# Patient Record
Sex: Female | Born: 1995 | Race: White | Hispanic: No | Marital: Single | Smoking: Never smoker
Health system: Southern US, Community
[De-identification: ages and names within clinical notes are randomized; demographics above are authoritative.]

## PROBLEM LIST (undated history)

## (undated) DIAGNOSIS — R51 Headache: Secondary | ICD-10-CM

## (undated) DIAGNOSIS — N390 Urinary tract infection, site not specified: Secondary | ICD-10-CM

## (undated) DIAGNOSIS — Z9049 Acquired absence of other specified parts of digestive tract: Secondary | ICD-10-CM

## (undated) DIAGNOSIS — S060X9A Concussion with loss of consciousness of unspecified duration, initial encounter: Secondary | ICD-10-CM

## (undated) HISTORY — DX: Headache: R51

## (undated) HISTORY — DX: Acquired absence of other specified parts of digestive tract: Z90.49

## (undated) HISTORY — DX: Urinary tract infection, site not specified: N39.0

## (undated) HISTORY — DX: Concussion with loss of consciousness of unspecified duration, initial encounter: S06.0X9A

---
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
## 2016-01-12 ENCOUNTER — Ambulatory Visit (INDEPENDENT_AMBULATORY_CARE_PROVIDER_SITE_OTHER): Payer: BLUE CROSS/BLUE SHIELD

## 2016-01-12 ENCOUNTER — Ambulatory Visit (INDEPENDENT_AMBULATORY_CARE_PROVIDER_SITE_OTHER): Payer: BLUE CROSS/BLUE SHIELD | Admitting: Podiatry

## 2016-01-12 ENCOUNTER — Encounter: Payer: Self-pay | Admitting: Podiatry

## 2016-01-12 DIAGNOSIS — M79671 Pain in right foot: Secondary | ICD-10-CM

## 2016-01-12 DIAGNOSIS — S92302G Fracture of unspecified metatarsal bone(s), left foot, subsequent encounter for fracture with delayed healing: Secondary | ICD-10-CM

## 2016-01-12 NOTE — Progress Notes (Signed)
   Subjective:    Patient ID: Brandi Blackwell, female    DOB: 1995-05-09, 20 y.o.   MRN: [id number]  HPI Chief Complaint  Patient presents with  . Foot Pain    Left foot; lateral side & plantar forefoot; pt stated, "Broke foot February 2016; pain radiates up foot to above the ankle"     Review of Systems  All other systems reviewed and are negative.      Objective:   Physical Exam        Assessment & Plan:

## 2016-01-13 ENCOUNTER — Telehealth: Payer: Self-pay | Admitting: *Deleted

## 2016-01-13 DIAGNOSIS — S92302G Fracture of unspecified metatarsal bone(s), left foot, subsequent encounter for fracture with delayed healing: Secondary | ICD-10-CM

## 2016-01-13 NOTE — Telephone Encounter (Signed)
Dr. Charlsie Merlesegal ordered MRI left foot, r/o non-union fracture left 5th metatarsal base. [facility]

## 2016-01-14 NOTE — Progress Notes (Signed)
Subjective:     Patient ID: Brandi Blackwell, female   DOB: 09-09-1995, 20 y.o.   MRN: [id number]  HPI patient presents with family stating foot last year and it still giving her a lot of problems and she cannot be active   Review of Systems  All other systems reviewed and are negative.      Objective:   Physical Exam  Constitutional: She is oriented to person, place, and time.  Cardiovascular: Intact distal pulses.   Musculoskeletal: Normal range of motion.  Neurological: She is oriented to person, place, and time.  Skin: Skin is warm.  Nursing note and vitals reviewed.  neurovascular status is found to be intact with muscle strength adequate range of motion within normal limits with patient noted to have inflammation and pain in the left lateral foot around the base of the fifth metatarsal. There is fluid buildup in the area with redness and it does have exquisite discomfort when I palpated deep into the tissue. Patient's found have good digital perfusion and is well oriented 3     Assessment:     Possibility for nonunion of bone injury left or some form of soft tissue injury    Plan:     H&P x-rays reviewed and at this point I am sending for MRI to rule out an nonunion the left base of fifth metatarsal. Could not make determination on conventional x-rays  X-ray report was negative for nonunion but so difficult to tell him with 18 month history of pain cannot be discounted

## 2016-01-27 ENCOUNTER — Telehealth: Payer: Self-pay | Admitting: *Deleted

## 2016-01-27 NOTE — Telephone Encounter (Signed)
"  Patient needs prior authorization for MRI."    Authorization was obtained and faxed.

## 2016-01-28 ENCOUNTER — Ambulatory Visit
Admission: RE | Admit: 2016-01-28 | Discharge: 2016-01-28 | Disposition: A | Discharge status: Home / Self Care | Payer: Self-pay | Source: Ambulatory Visit | Attending: Podiatry | Admitting: Podiatry

## 2016-02-11 ENCOUNTER — Ambulatory Visit (INDEPENDENT_AMBULATORY_CARE_PROVIDER_SITE_OTHER): Payer: BLUE CROSS/BLUE SHIELD | Admitting: Podiatry

## 2016-02-11 ENCOUNTER — Encounter: Payer: Self-pay | Admitting: Podiatry

## 2016-02-11 DIAGNOSIS — M779 Enthesopathy, unspecified: Secondary | ICD-10-CM

## 2016-02-11 DIAGNOSIS — S92302G Fracture of unspecified metatarsal bone(s), left foot, subsequent encounter for fracture with delayed healing: Secondary | ICD-10-CM

## 2016-02-11 DIAGNOSIS — G8921 Chronic pain due to trauma: Secondary | ICD-10-CM | POA: Diagnosis not present

## 2016-02-11 MED ORDER — TRIAMCINOLONE ACETONIDE 10 MG/ML IJ SUSP
10.0000 mg | Freq: Once | INTRAMUSCULAR | Status: AC
  Administered 2016-02-11: 10 mg

## 2016-02-11 MED ORDER — GABAPENTIN 300 MG PO CAPS: 300.0000 mg | ORAL_CAPSULE | Freq: Three times a day (TID) | ORAL | 3 refills | Status: DC

## 2016-02-11 NOTE — Progress Notes (Signed)
Subjective:     Patient ID: Brandi Blackwell, female   DOB: 1995/10/25, 20 y.o.   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 Patient states the pain is worse when trying to ambulate and she does have a boot at home   Review of Systems     Objective:   Physical Exam Neurovascular status was intact with mild mottled skin formation of the lateral side left foot with no indications of current bone pathology. MRI was normal and is not indicate there is a bony process going on and the pain is quite intense around the insertion base of fifth metatarsal    Assessment:     Possibility for a chronic acute type tenderness condition left at the insertion peroneal into the base of fifth metatarsal versus the possibilities for some form of chronic pain syndrome    Plan:     H&P condition reviewed and careful sheath injection administered 3 Milligan Kenalog 5 mill grams Xylocaine and also went ahead today and placed on gabapentin 3 mg 3 times a day and we'll start cream to try to reduce the inflammatory or neurological process. She will utilize immobilization at home and will be seen back in 3-4 weeks to reevaluate or earlier if needed

## 2016-02-12 MED ORDER — NONFORMULARY OR COMPOUNDED ITEM: 2 refills | Status: AC

## 2016-02-12 NOTE — Addendum Note (Signed)
Addended by: Alphia Kava'CONNELL, VALERY D on: 02/12/2016 12:05 PM   Modules accepted: Orders

## 2016-03-15 ENCOUNTER — Ambulatory Visit (INDEPENDENT_AMBULATORY_CARE_PROVIDER_SITE_OTHER): Payer: BLUE CROSS/BLUE SHIELD

## 2016-03-15 ENCOUNTER — Ambulatory Visit (INDEPENDENT_AMBULATORY_CARE_PROVIDER_SITE_OTHER): Payer: BLUE CROSS/BLUE SHIELD | Admitting: Podiatry

## 2016-03-15 DIAGNOSIS — M79672 Pain in left foot: Secondary | ICD-10-CM

## 2016-03-15 DIAGNOSIS — S92302G Fracture of unspecified metatarsal bone(s), left foot, subsequent encounter for fracture with delayed healing: Secondary | ICD-10-CM | POA: Diagnosis not present

## 2016-03-16 LAB — URIC ACID: URIC ACID, SERUM: 3.7 mg/dL (ref 2.5–7.0)

## 2016-03-16 LAB — SEDIMENTATION RATE: SED RATE: 4 mm/h (ref 0–20)

## 2016-03-16 LAB — RHEUMATOID FACTOR: Rhuematoid fact SerPl-aCnc: <14 IU/mL (ref <14)

## 2016-03-16 LAB — ANA: Anti Nuclear Antibody(ANA): NEGATIVE

## 2016-03-16 LAB — C-REACTIVE PROTEIN: CRP: 2.2 mg/L (ref <8.0)

## 2016-03-17 NOTE — Progress Notes (Signed)
Subjective:     Patient ID: Brandi Blackwell, female   DOB: [birth year], 20 y.o.   MRN: [id number]  HPI patient presents with mother with continued discomfort in the outside of the left foot around the base of the fifth metatarsal with patient stating that she could not take the gabapentin as it made her drowsy she cannot utilize the cream and while in the boot it is mildly better but when she gets out of it she continues to have pain and it's now been present for approximately 20 months since tentative diagnosis of fracture of the fifth metatarsal base left   Review of Systems     Objective:   Physical Exam Neurovascular status remains intact with no mottled skin appearance no coolness to the foot note sweating noted. There is quite a bit of discomfort at the insertion of the base of the fifth metatarsal left and I can make the argument that there is some swelling around this area of a mild to moderate nature. The tendon itself is functioning well but mild discomfort proximal to this area in the peroneal brevis tendon but not to the same degree as insertion    Assessment:     Very difficult problem with possibility for some kind of chronic pain syndrome versus possibility for subtle damage to the fifth metatarsal base with MRI and x-rays up to this point that have been normal and also cannot rule out some kind of a systemic inflammatory condition and again discussed the fact that when her hip was damaged it took several years to get moderately better and still gives her some trouble from when she was in high school    Plan:     H&P and x-ray and condition reviewed at great length. At this point I am sending her for blood panel to rule out an arthritic issue and I'm asking for her records from the original injury so I can compare them to today. Patient may ultimately require surgery but I'm trying to be as conservative as I can't but I am concerned about the continued significant discomfort she is  experiencing  X-ray report indicates there could be some subtle changes around the base of the fifth metatarsal but no indications of a nonunion or delayed union process

## 2016-04-26 ENCOUNTER — Ambulatory Visit: Payer: BLUE CROSS/BLUE SHIELD | Admitting: Podiatry

## 2016-04-29 ENCOUNTER — Ambulatory Visit: Payer: BLUE CROSS/BLUE SHIELD | Admitting: Podiatry

## 2016-05-06 ENCOUNTER — Ambulatory Visit: Payer: BLUE CROSS/BLUE SHIELD | Admitting: Podiatry

## 2016-05-20 ENCOUNTER — Ambulatory Visit: Payer: BLUE CROSS/BLUE SHIELD | Admitting: Podiatry

## 2017-01-18 ENCOUNTER — Telehealth: Payer: Self-pay | Admitting: Podiatry

## 2017-01-18 NOTE — Telephone Encounter (Signed)
My daughter saw Dr. Charlsie Merles and had x-rays done to see what could be done for a fracture she suffered in 2016. She has an appointment with another specialist in [place] on Monday. I was calling to see about getting her images and notes sent to that office. If you would please call me back on my cell phone at [phone]. That would be awesome. Thanks.

## 2017-01-20 ENCOUNTER — Encounter: Payer: Self-pay | Admitting: Podiatry

## 2017-01-20 NOTE — Progress Notes (Addendum)
CD of X-rays put up front to be mailed to [facility] today per signed medical records release form.

## 2017-08-04 IMAGING — MR MR FOOT*L* W/O CM
4 of 6 series · 21 of 40 positions shown · non-contrast
Comparison: None.

CLINICAL DATA: Fracture at the base of the fifth metatarsal 1 year
ago. Immobilized in a boot for 3 weeks. Did well until recently,
pain returned.

EXAM:
MRI OF THE LEFT FOOT WITHOUT CONTRAST
TECHNIQUE: Multiplanar, multisequence MR imaging was performed. No intravenous
contrast was administered.

[Series 5: T1 · coronal · 4.0mm · 0.38mm/px · 3 of 27 slices shown]
[im 5/27]
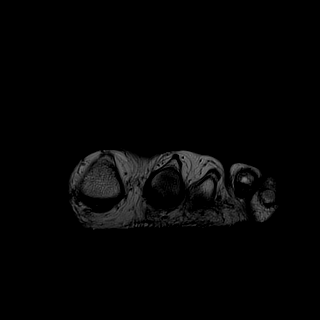
[im 14/27]
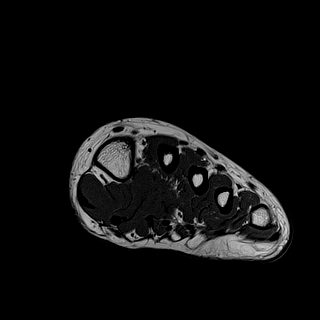
[im 22/27]
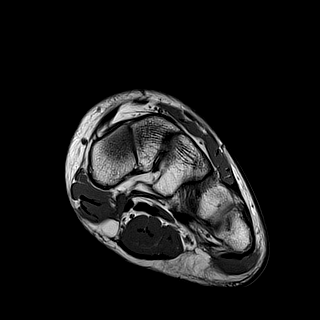

[Series 6: T2 fat-sat · coronal · 4.0mm · 0.23mm/px · 8 of 27 slices shown (1 of 3)]
[im 1/27]
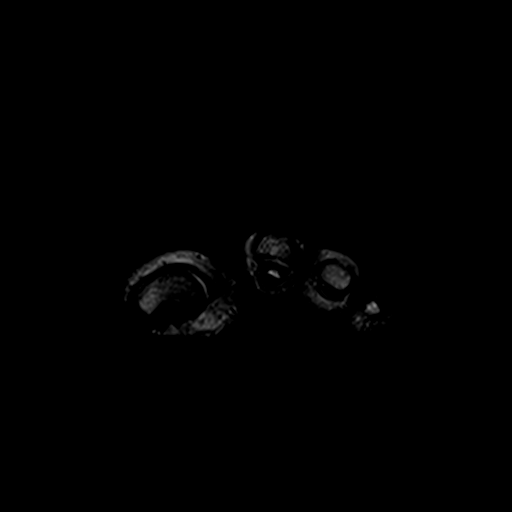
[im 4/27]
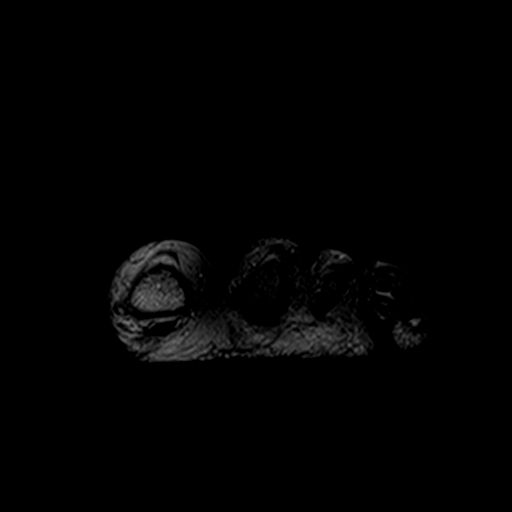
[im 8/27]
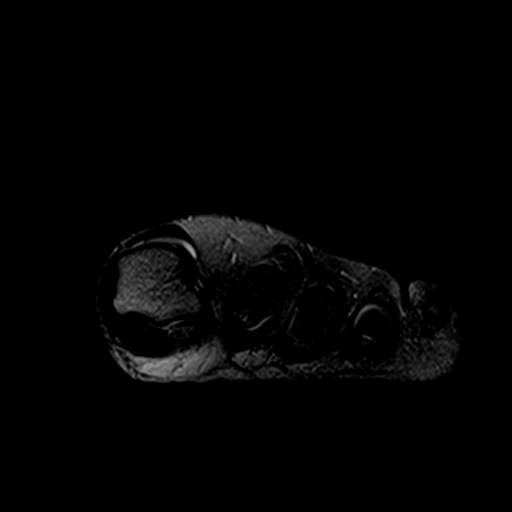
[im 12/27]
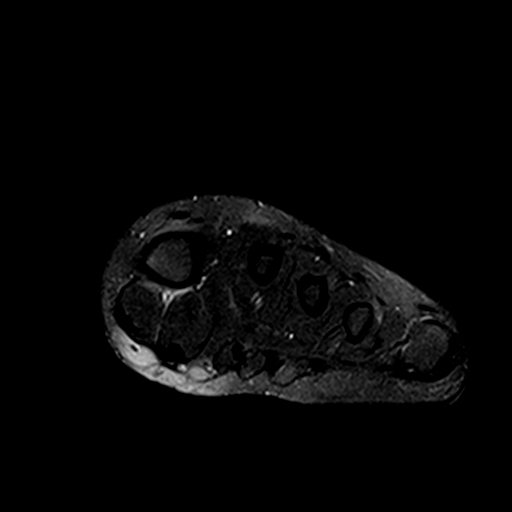
[im 15/27]
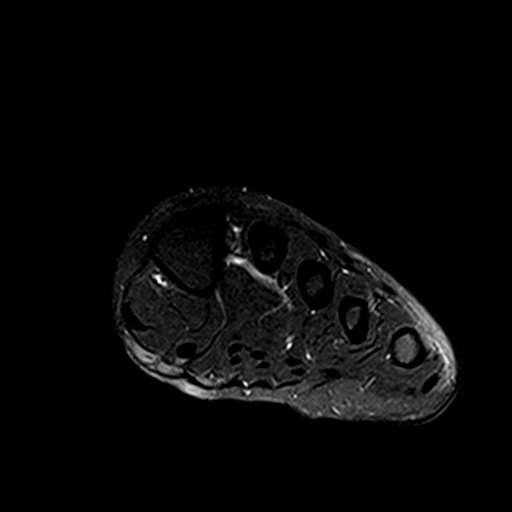
[im 19/27]
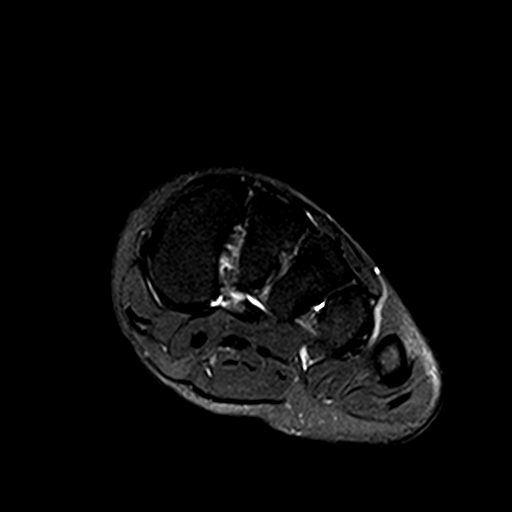
[im 23/27]
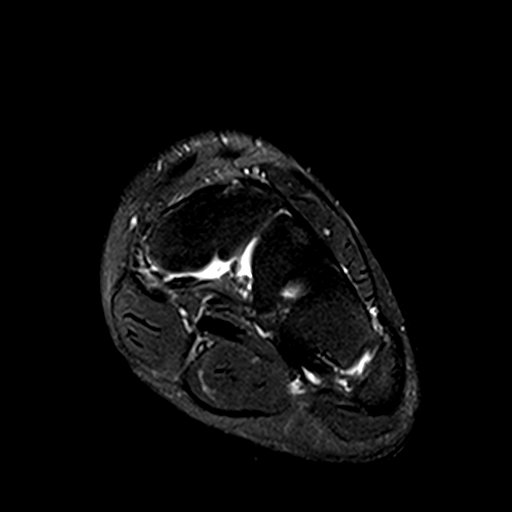
[im 27/27]
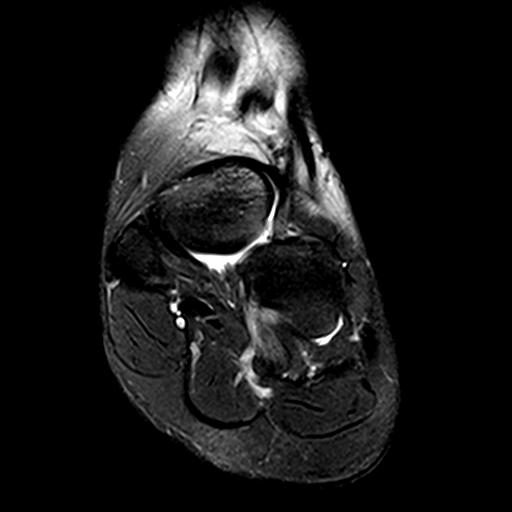

[Series 8: T2 fat-sat · axial · 2.5mm · 0.35mm/px · z∈[-103,-57]mm · 6 of 20 slices shown (2 of 3)]
[im 1/20]
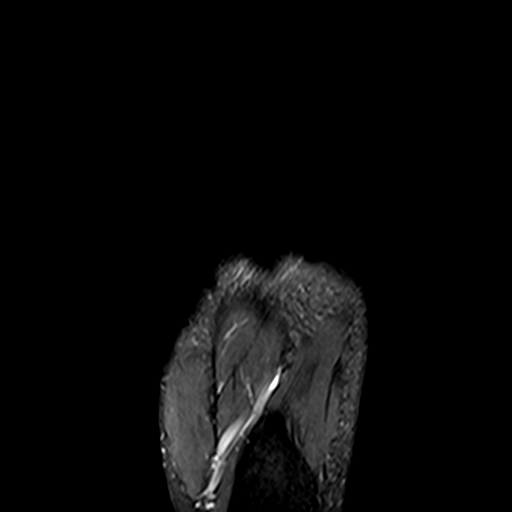
[im 4/20]
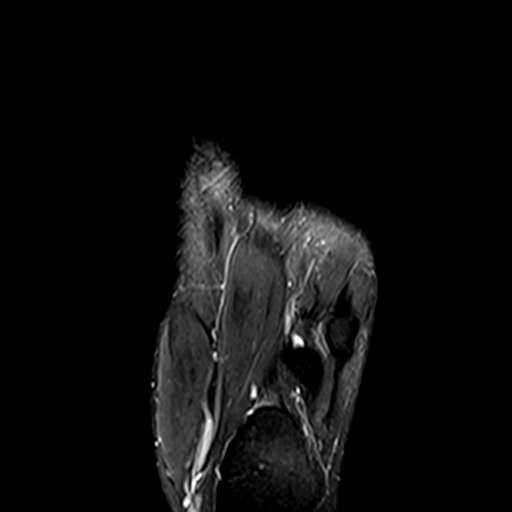
[im 8/20]
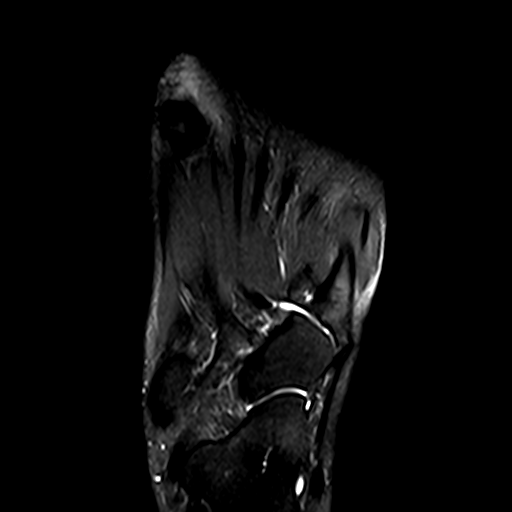
[im 12/20]
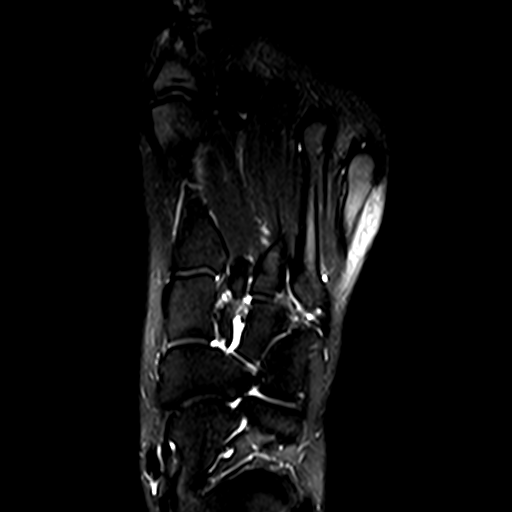
[im 16/20]
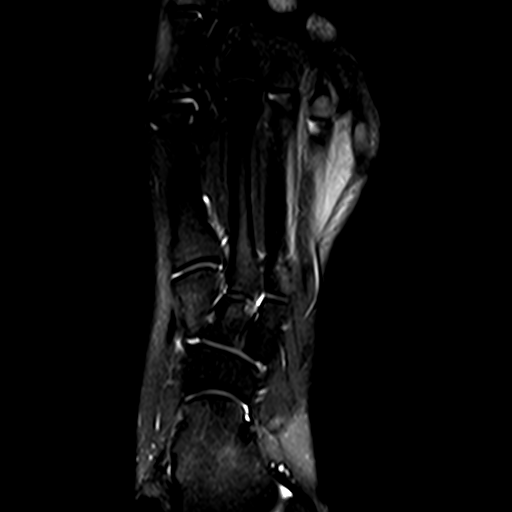
[im 20/20]
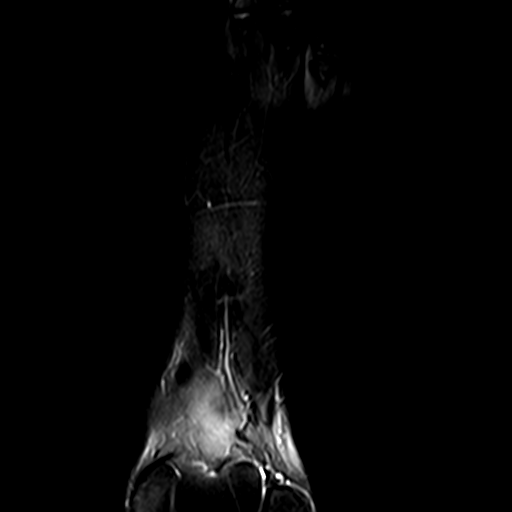

[Series 9: T2 fat-sat · sagittal · 3.0mm · 0.35mm/px · 4 of 25 slices shown (3 of 3)]
[im 1/25]
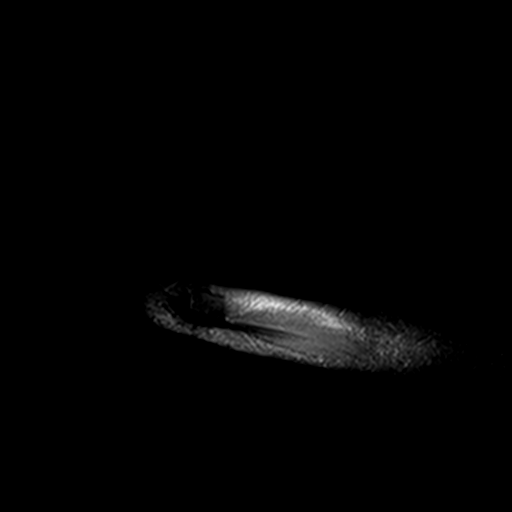
[im 5/25]
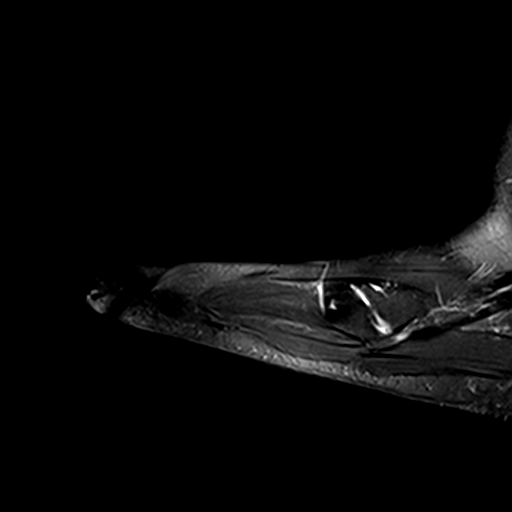
[im 13/25]
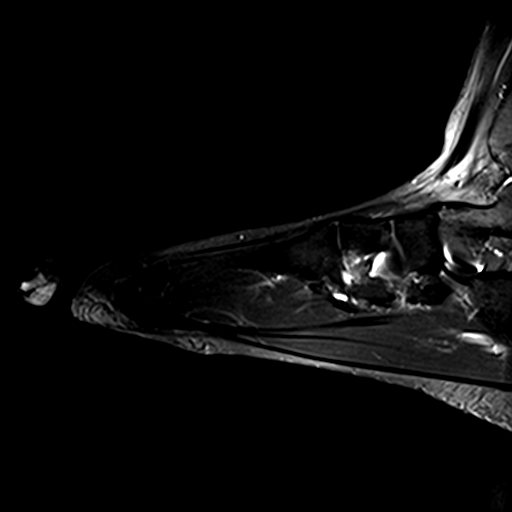
[im 21/25]
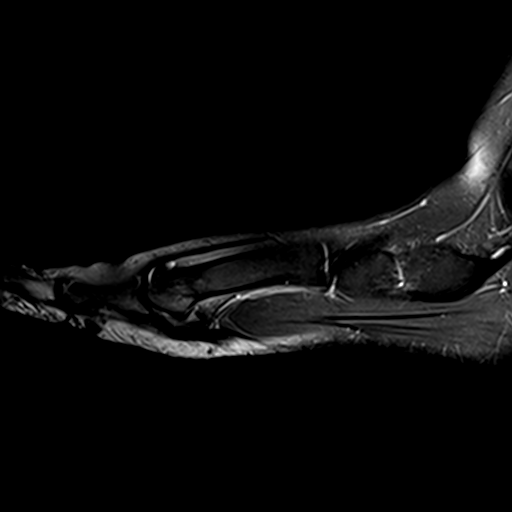

[21 of 40 positions shown; findings below may reference images not displayed]

FINDINGS: Bones/Joint/Cartilage

Mild marrow edema in the fifth metatarsal shaft without a linear
signal abnormality most consistent with stress reaction. No
periosteal reaction.

No acute fracture or dislocation. Normal alignment. No joint
effusion. Mild osteoarthritis of the first MTP joint with mild
subchondral reactive marrow changes in the first metatarsal head.
Bipartite medial hallux sesamoid.

Ligaments

Collateral ligaments are intact. Lisfranc ligament is intact.
Visualized anterior talofibular ligament is intact.

Muscles and Tendons
Flexor, peroneal and extensor compartment tendons are intact.
Muscles are normal in signal.

Soft tissue
No fluid collection or hematoma.  No soft tissue mass.
IMPRESSION: 1. Mild stress reaction of the fifth metatarsal shaft of the left
foot without a fracture.
2. Mild osteoarthritis of the first MTP joint.
